# Patient Record
Sex: Male | Born: 2000 | Race: White | Hispanic: No | Marital: Single | Smoking: Never smoker
Health system: Southern US, Community
[De-identification: ages and names within clinical notes are randomized; demographics above are authoritative.]

## PROBLEM LIST (undated history)

## (undated) DIAGNOSIS — K219 Gastro-esophageal reflux disease without esophagitis: Secondary | ICD-10-CM

## (undated) HISTORY — PX: OTHER SURGICAL HISTORY: SHX169

## (undated) HISTORY — DX: Gastro-esophageal reflux disease without esophagitis: K21.9

---
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
## 2020-03-14 ENCOUNTER — Other Ambulatory Visit: Payer: Self-pay

## 2020-03-14 ENCOUNTER — Emergency Department (HOSPITAL_BASED_OUTPATIENT_CLINIC_OR_DEPARTMENT_OTHER): Payer: 59

## 2020-03-14 ENCOUNTER — Encounter (HOSPITAL_BASED_OUTPATIENT_CLINIC_OR_DEPARTMENT_OTHER): Payer: Self-pay

## 2020-03-14 ENCOUNTER — Emergency Department (HOSPITAL_BASED_OUTPATIENT_CLINIC_OR_DEPARTMENT_OTHER)
Admission: EM | Admit: 2020-03-14 | Discharge: 2020-03-15 | Disposition: A | Discharge status: Home / Self Care | Payer: 59 | Attending: Emergency Medicine | Admitting: Emergency Medicine

## 2020-03-14 DIAGNOSIS — W500XXA Accidental hit or strike by another person, initial encounter: Secondary | ICD-10-CM | POA: Diagnosis not present

## 2020-03-14 DIAGNOSIS — Y9389 Activity, other specified: Secondary | ICD-10-CM | POA: Insufficient documentation

## 2020-03-14 DIAGNOSIS — Y9289 Other specified places as the place of occurrence of the external cause: Secondary | ICD-10-CM | POA: Insufficient documentation

## 2020-03-14 DIAGNOSIS — Z79899 Other long term (current) drug therapy: Secondary | ICD-10-CM | POA: Insufficient documentation

## 2020-03-14 DIAGNOSIS — Y998 Other external cause status: Secondary | ICD-10-CM | POA: Insufficient documentation

## 2020-03-14 DIAGNOSIS — T18128A Food in esophagus causing other injury, initial encounter: Secondary | ICD-10-CM | POA: Insufficient documentation

## 2020-03-14 MED ORDER — GLUCAGON HCL RDNA (DIAGNOSTIC) 1 MG IJ SOLR
1.0000 mg | Freq: Once | INTRAMUSCULAR | Status: AC
  Administered 2020-03-14: 1 mg via INTRAVENOUS
  Filled 2020-03-14: qty 1

## 2020-03-14 NOTE — ED Triage Notes (Signed)
Pt c/o feeling like something stuck in throat x 30 min while eating boneless chicken breast-NAD-spitting in emesis bag

## 2020-03-15 MED ORDER — ONDANSETRON HCL 4 MG/2ML IJ SOLN
4.0000 mg | Freq: Once | INTRAMUSCULAR | Status: AC
  Administered 2020-03-15: 4 mg via INTRAVENOUS
  Filled 2020-03-15: qty 2

## 2020-03-15 NOTE — ED Notes (Signed)
While pt was in parking lot, he vomited up food bolus. Pt is able to keep water down. Dr. Read Drivers in to re-evaluate.

## 2020-03-15 NOTE — ED Notes (Signed)
[facility] ED POV

## 2020-03-15 NOTE — ED Provider Notes (Addendum)
[facility] Provider Note: Lowella Dell, MD, FACEP  CSN: [id number] MRN: [id number] ARRIVAL: 03/14/20 at 2127 ROOM: [facility]11/[facility]   CHIEF COMPLAINT  Food Stuck in Esophagus   HISTORY OF PRESENT ILLNESS  03/15/20 12:57 AM Christian Payne is a 19 y.o. male was eating a chicken burrito about 8:30 PM when a bite got suddenly stuck in his throat.  He had a choking sensation followed by the inability to swallow even liquids.  He states he has had similar episodes in the past but never requiring medical intervention.  He has been given 2 doses of IV glucagon without relief of his symptoms and he is still unable to swallow.  Leaning forward makes his symptoms worse.  He rates associated esophageal pain as 8 out of 10, dull in nature.   History reviewed. No pertinent past medical history.  History reviewed. No pertinent surgical history.  Family History  Problem Relation Age of Onset  . Arthritis Neg Hx   . Alcohol abuse Neg Hx   . Asthma Neg Hx   . Birth defects Neg Hx   . Cancer Neg Hx   . COPD Neg Hx   . Depression Neg Hx   . Diabetes Neg Hx   . Drug abuse Neg Hx   . Early death Neg Hx   . Hearing loss Neg Hx   . Heart disease Neg Hx   . Hyperlipidemia Neg Hx   . Hypertension Neg Hx   . Kidney disease Neg Hx   . Learning disabilities Neg Hx   . Mental illness Neg Hx   . Mental retardation Neg Hx   . Miscarriages / Stillbirths Neg Hx   . Stroke Neg Hx   . Vision loss Neg Hx   . Varicose Veins Neg Hx     Social History   Tobacco Use  . Smoking status: Never Smoker  . Smokeless tobacco: Never Used  Vaping Use  . Vaping Use: Some days  Substance Use Topics  . Alcohol use: Yes    Comment: weekly  . Drug use: Never    Prior to Admission medications   Medication Sig Start Date End Date Taking? Authorizing Provider  fluticasone (FLONASE) 50 MCG/ACT nasal spray Place 2 sprays into both nostrils daily. 09/06/13 10/07/13  Georgiann Hahn, MD    HYDROcodone-acetaminophen (NORCO/VICODIN) 5-325 MG tablet Take 1 tablet by mouth every 4 (four) hours as needed for moderate pain. 03/07/16   Ronnell Freshwater, NP  levofloxacin (LEVAQUIN) 500 MG tablet Take 1 tablet (500 mg total) by mouth daily. X 7 days 03/11/15   Lowanda Foster, NP  mupirocin ointment (BACTROBAN) 2 % Apply 1 application topically 2 (two) times daily. 03/11/15   Lowanda Foster, NP    Allergies Patient has no known allergies.   REVIEW OF SYSTEMS  Negative except as noted here or in the History of Present Illness.   PHYSICAL EXAMINATION  Initial Vital Signs Blood pressure 133/75, pulse 83, temperature 99.3 F (37.4 C), temperature source Oral, resp. rate 19, height 5\' 11"  (1.803 m), weight 85.7 kg, SpO2 100 %.  Examination General: Well-developed, well-nourished male in no acute distress; appearance consistent with age of record HENT: normocephalic; atraumatic Eyes: pupils equal, round and reactive to light; extraocular muscles intact Neck: supple Heart: regular rate and rhythm Lungs: clear to auscultation bilaterally Abdomen: soft; nondistended; nontender; bowel sounds present Extremities: No deformity; full range of motion; pulses normal Neurologic: Awake, alert and oriented; motor function intact in all  extremities and symmetric; no facial droop Skin: Warm and dry Psychiatric: Normal mood and affect   RESULTS  Summary of this visit's results, reviewed and interpreted by myself:   EKG Interpretation  Date/Time:    Ventricular Rate:    PR Interval:    QRS Duration:   QT Interval:    QTC Calculation:   R Axis:     Text Interpretation:        Laboratory Studies: No results found for this or any previous visit (from the past 24 hour(s)). Imaging Studies: DG Neck Soft Tissue  Result Date: 03/14/2020 CLINICAL DATA:  Question esophageal food bolus with globus sensation after eating boneless chicken. EXAM: NECK SOFT TISSUES - 1+ VIEW  COMPARISON:  None. FINDINGS: There is no evidence of retropharyngeal soft tissue swelling or gas. The epiglottis remains sharp and well-defined without enlargement. The cervical airway is unremarkable and no radio-opaque foreign body identified. Portion of the oropharynx and nasopharynx is collimated from view. Minimal spondylitic changes in the spine. No acute abnormality in the upper chest or imaged lung apices. IMPRESSION: 1. No acute abnormality. 2. Portion of the superior oropharynx and nasopharynx is collimated from view. Electronically Signed   By: Lovena Le M.D.   On: 03/14/2020 21:57    ED COURSE and MDM  Nursing notes, initial and subsequent vitals signs, including pulse oximetry, reviewed and interpreted by myself.  Vitals:   03/14/20 2139 03/14/20 2343  BP: (!) 135/99 133/75  Pulse: 86 83  Resp: 18 19  Temp: 99.3 F (37.4 C)   TempSrc: Oral   SpO2: 100% 100%  Weight: 85.7 kg   Height: 5\' 11"  (1.803 m)    Medications  ondansetron (ZOFRAN) injection 4 mg (has no administration in time range)  glucagon (human recombinant) (GLUCAGEN) injection 1 mg (1 mg Intravenous Given 03/14/20 2323)  glucagon (human recombinant) (GLUCAGEN) injection 1 mg (1 mg Intravenous Given 03/14/20 2355)   1:12 AM Dr. Lucio Edward accepts patient for transfer to [facility] where he expects to perform an endoscopy as the first case in the morning.  1:37 AM Patient vomited and relieved his obstruction.  He is now able to swallow without difficulty.  Will cancel consult with Dr. Fuller Plan and have him follow-up as an outpatient.  PROCEDURES  Procedures   ED DIAGNOSES     ICD-10-CM   1. Food impaction of esophagus, initial encounter  E83.151V        Shanon Rosser, MD 03/15/20 0119    Shanon Rosser, MD 03/15/20 [phone]

## 2020-05-08 ENCOUNTER — Encounter: Payer: Self-pay | Admitting: Gastroenterology

## 2020-05-08 ENCOUNTER — Telehealth: Payer: Self-pay | Admitting: Gastroenterology

## 2020-05-08 ENCOUNTER — Ambulatory Visit (INDEPENDENT_AMBULATORY_CARE_PROVIDER_SITE_OTHER): Payer: 59 | Admitting: Gastroenterology

## 2020-05-08 VITALS — BP 118/58 | HR 75 | Ht 71.0 in | Wt 192.0 lb

## 2020-05-08 DIAGNOSIS — K219 Gastro-esophageal reflux disease without esophagitis: Secondary | ICD-10-CM

## 2020-05-08 DIAGNOSIS — R1319 Other dysphagia: Secondary | ICD-10-CM

## 2020-05-08 DIAGNOSIS — R1310 Dysphagia, unspecified: Secondary | ICD-10-CM

## 2020-05-08 MED ORDER — PANTOPRAZOLE SODIUM 40 MG PO TBEC: 40.0000 mg | DELAYED_RELEASE_TABLET | Freq: Every day | ORAL | 11 refills | Status: DC

## 2020-05-08 NOTE — Telephone Encounter (Signed)
Patient called and he decided to go with a OTC medication instead please advise

## 2020-05-08 NOTE — Progress Notes (Signed)
History of Present Illness: This is a 19 year old male referred by Georgiann Hahn, MD for the evaluation of food impaction, dysphagia, GERD.  He relates a several year history of intermittent heartburn and intermittent solid food dysphagia.  He presented to [facility] ED in late June with a food impaction, unable to handle secretions.  He was eating chicken prior to the event.  He was in the process of being transferred to [facility] emergency department for endoscopy with food impaction removal when he spontaneously vomited and the food impaction cleared. Denies weight loss, abdominal pain, constipation, diarrhea, change in stool caliber, melena, hematochezia, nausea, vomiting, chest pain.    No Known Allergies Outpatient Medications Prior to Visit  Medication Sig Dispense Refill  . fluticasone (FLONASE) 50 MCG/ACT nasal spray Place 2 sprays into both nostrils daily. 16 g 2   No facility-administered medications prior to visit.   History reviewed. No pertinent past medical history. History reviewed. No pertinent surgical history. Social History   Socioeconomic History  . Marital status: Single    Spouse name: Not on file  . Number of children: Not on file  . Years of education: Not on file  . Highest education level: Not on file  Occupational History  . Not on file  Tobacco Use  . Smoking status: Never Smoker  . Smokeless tobacco: Never Used  Vaping Use  . Vaping Use: Some days  Substance and Sexual Activity  . Alcohol use: Yes    Alcohol/week: 3.0 standard drinks    Types: 3 Shots of liquor per week    Comment: weekly   . Drug use: Never  . Sexual activity: Not on file  Other Topics Concern  . Not on file  Social History Narrative  . Not on file   Social Determinants of Health   Financial Resource Strain:   . Difficulty of Paying Living Expenses:   Food Insecurity:   . Worried About Programme researcher, broadcasting/film/video in the Last Year:   . Barista in the  Last Year:   Transportation Needs:   . Freight forwarder (Medical):   Marland Kitchen Lack of Transportation (Non-Medical):   Physical Activity:   . Days of Exercise per Week:   . Minutes of Exercise per Session:   Stress:   . Feeling of Stress :   Social Connections:   . Frequency of Communication with Friends and Family:   . Frequency of Social Gatherings with Friends and Family:   . Attends Religious Services:   . Active Member of Clubs or Organizations:   . Attends Banker Meetings:   Marland Kitchen Marital Status:    Family History  Problem Relation Age of Onset  . GER disease Father   . Arthritis Neg Hx   . Alcohol abuse Neg Hx   . Asthma Neg Hx   . Birth defects Neg Hx   . Cancer Neg Hx   . COPD Neg Hx   . Depression Neg Hx   . Diabetes Neg Hx   . Drug abuse Neg Hx   . Early death Neg Hx   . Hearing loss Neg Hx   . Heart disease Neg Hx   . Hyperlipidemia Neg Hx   . Hypertension Neg Hx   . Kidney disease Neg Hx   . Learning disabilities Neg Hx   . Mental illness Neg Hx   . Mental retardation Neg Hx   . Miscarriages / [place]  Neg Hx   . Stroke Neg Hx   . Vision loss Neg Hx   . Varicose Veins Neg Hx       Review of Systems: Pertinent positive and negative review of systems were noted in the above HPI section. All other review of systems were otherwise negative.   Physical Exam: General: Well developed, well nourished, no acute distress Head: Normocephalic and atraumatic Eyes:  sclerae anicteric, EOMI Ears: Normal auditory acuity Mouth: Not examined, mask on during Covid-19 pandemic Neck: Supple, no masses or thyromegaly Lungs: Clear throughout to auscultation Heart: Regular rate and rhythm; no murmurs, rubs or bruits Abdomen: Soft, non tender and non distended. No masses, hepatosplenomegaly or hernias noted. Normal Bowel sounds Rectal: Not done Musculoskeletal: Symmetrical with no gross deformities  Skin: No lesions on visible extremities Pulses:  Normal  pulses noted Extremities: No clubbing, cyanosis, edema or deformities noted Neurological: Alert oriented x 4, grossly nonfocal Cervical Nodes:  No significant cervical adenopathy Inguinal Nodes: No significant inguinal adenopathy Psychological:  Alert and cooperative. Normal mood and affect   Assessment and Recommendations:  1.  GERD, intermittent solid food dysphagia, recent food impaction.  Rule out esophagitis, esophageal stricture.  Follow standard antireflux measures.  Begin pantoprazole 40 mg p.o. every morning long-term.  Schedule EGD with possible biopsy and possible dilation. The risks (including bleeding, perforation, infection, missed lesions, medication reactions and possible hospitalization or surgery if complications occur), benefits, and alternatives to endoscopy with possible biopsy and possible dilation were discussed with the patient and they consent to proceed.     cc: Georgiann Hahn, MD [address] [place],  [place] [address]

## 2020-05-08 NOTE — Patient Instructions (Signed)
We have sent the following medications to your pharmacy for you to pick up at your convenience: pantoprazole.  Patient advised to avoid spicy, acidic, citrus, chocolate, mints, fruit and fruit juices.  Limit the intake of caffeine, alcohol and Soda.  Don't exercise too soon after eating.  Don't lie down within 3-4 hours of eating.  Elevate the head of your bed.  You have been scheduled for an endoscopy. Please follow written instructions given to you at your visit today. If you use inhalers (even only as needed), please bring them with you on the day of your procedure.  Thank you for choosing me and [facility].  Malcolm T. Stark, Jr., MD., FACG   

## 2020-05-09 ENCOUNTER — Other Ambulatory Visit: Payer: Self-pay

## 2020-05-09 ENCOUNTER — Ambulatory Visit (AMBULATORY_SURGERY_CENTER): Payer: 59 | Admitting: Gastroenterology

## 2020-05-09 ENCOUNTER — Encounter: Payer: Self-pay | Admitting: Gastroenterology

## 2020-05-09 VITALS — BP 107/54 | HR 75 | Temp 98.1°F | Resp 14 | Ht 71.0 in | Wt 192.0 lb

## 2020-05-09 DIAGNOSIS — K2100 Gastro-esophageal reflux disease with esophagitis, without bleeding: Secondary | ICD-10-CM

## 2020-05-09 DIAGNOSIS — K200 Eosinophilic esophagitis: Secondary | ICD-10-CM | POA: Diagnosis not present

## 2020-05-09 DIAGNOSIS — R1310 Dysphagia, unspecified: Secondary | ICD-10-CM

## 2020-05-09 DIAGNOSIS — K219 Gastro-esophageal reflux disease without esophagitis: Secondary | ICD-10-CM

## 2020-05-09 MED ORDER — SODIUM CHLORIDE 0.9 % IV SOLN: 500.0000 mL | Freq: Once | INTRAVENOUS | Status: DC

## 2020-05-09 NOTE — Patient Instructions (Signed)
YOU HAD AN ENDOSCOPIC PROCEDURE TODAY AT THE  ENDOSCOPY CENTER:   Refer to the procedure report that was given to you for any specific questions about what was found during the examination.  If the procedure report does not answer your questions, please call your gastroenterologist to clarify.  If you requested that your care partner not be given the details of your procedure findings, then the procedure report has been included in a sealed envelope for you to review at your convenience later.  YOU SHOULD EXPECT: Some feelings of bloating in the abdomen. Passage of more gas than usual.  Walking can help get rid of the air that was put into your GI tract during the procedure and reduce the bloating. If you had a lower endoscopy (such as a colonoscopy or flexible sigmoidoscopy) you may notice spotting of blood in your stool or on the toilet paper. If you underwent a bowel prep for your procedure, you may not have a normal bowel movement for a few days.  **Handout given on Post Dilation Diet**  Please Note:  You might notice some irritation and congestion in your nose or some drainage.  This is from the oxygen used during your procedure.  There is no need for concern and it should clear up in a day or so.  SYMPTOMS TO REPORT IMMEDIATELY:    Following upper endoscopy (EGD)  Vomiting of blood or coffee ground material  New chest pain or pain under the shoulder blades  Painful or persistently difficult swallowing  New shortness of breath  Fever of 100F or higher  Black, tarry-looking stools  For urgent or emergent issues, a gastroenterologist can be reached at any hour by calling [phone]. Do not use MyChart messaging for urgent concerns.    DIET:  We do recommend a small meal at first, but then you may proceed to your regular diet.  Drink plenty of fluids but you should avoid alcoholic beverages for 24 hours.  ACTIVITY:  You should plan to take it easy for the rest of today and you  should NOT DRIVE or use heavy machinery until tomorrow (because of the sedation medicines used during the test).    FOLLOW UP: Our staff will call the number listed on your records 48-72 hours following your procedure to check on you and address any questions or concerns that you may have regarding the information given to you following your procedure. If we do not reach you, we will leave a message.  We will attempt to reach you two times.  During this call, we will ask if you have developed any symptoms of COVID 19. If you develop any symptoms (ie: fever, flu-like symptoms, shortness of breath, cough etc.) before then, please call [phone].  If you test positive for Covid 19 in the 2 weeks post procedure, please call and report this information to [place].    If any biopsies were taken you will be contacted by phone or by letter within the next 1-3 weeks.  Please call us at [phone] if you have not heard about the biopsies in 3 weeks.    SIGNATURES/CONFIDENTIALITY: You and/or your care partner have signed paperwork which will be entered into your electronic medical record.  These signatures attest to the fact that that the information above on your After Visit Summary has been reviewed and is understood.  Full responsibility of the confidentiality of this discharge information lies with you and/or your care-partner.

## 2020-05-09 NOTE — Op Note (Addendum)
[facility] Patient Name: Christian Payne Procedure Date: 05/09/2020 2:17 PM MRN: [id number] Endoscopist: Meryl Dare , MD Age: 19 Referring MD:  Date of Birth: 04-20-2001 Gender: Male Account #: [id number] Procedure:                Upper GI endoscopy Indications:              Dysphagia, Recent food impaction, Suspected                            gastroesophageal reflux disease Medicines:                Monitored Anesthesia Care Procedure:                Pre-Anesthesia Assessment:                           - Prior to the procedure, a History and Physical                            was performed, and patient medications and                            allergies were reviewed. The patient's tolerance of                            previous anesthesia was also reviewed. The risks                            and benefits of the procedure and the sedation                            options and risks were discussed with the patient.                            All questions were answered, and informed consent                            was obtained. Prior Anticoagulants: The patient has                            taken no previous anticoagulant or antiplatelet                            agents. ASA Grade Assessment: I - A normal, healthy                            patient. After reviewing the risks and benefits,                            the patient was deemed in satisfactory condition to                            undergo the procedure.  After obtaining informed consent, the endoscope was                            passed under direct vision. Throughout the                            procedure, the patient's blood pressure, pulse, and                            oxygen saturations were monitored continuously. The                            Endoscope was introduced through the mouth, and                            advanced to the second part of duodenum.  The upper                            GI endoscopy was accomplished without difficulty.                            The patient tolerated the procedure well. Scope In: Scope Out: Findings:                 Mucosal changes including ringed esophagus,                            longitudinal furrows and white plaques were found                            in the mid esophagus and in the distal esophagus.                            Biopsies were taken with a cold forceps for                            histology. A guidewire was placed and the scope was                            withdrawn. Dilations were performed with Savary                            dilators with mild resistance at 15 mm and 16 mm.                           LA Grade A (one or more mucosal breaks less than 5                            mm, not extending between tops of 2 mucosal folds)                            esophagitis with no bleeding was found at the EGJ,  40 cm from the incisors.                           The exam of the esophagus was otherwise normal.                           The entire examined stomach was normal.                           The duodenal bulb and second portion of the                            duodenum were normal. Complications:            No immediate complications. Estimated Blood Loss:     Estimated blood loss was minimal. Impression:               - Esophageal mucosal changes suggestive of                            eosinophilic esophagitis. Biopsied. Dilated.                           - LA Grade A esophagitis at the EGJ, 40 cm                           - Normal stomach.                           - Normal duodenal bulb and second portion of the                            duodenum. Recommendation:           - Patient has a contact number available for                            emergencies. The signs and symptoms of potential                            delayed  complications were discussed with the                            patient. Return to normal activities tomorrow.                            Written discharge instructions were provided to the                            patient.                           - Clear liquid diet for 2 hours, then advance as                            tolerated to soft diet today.                           -  Resume prior diet tomorrow.                           - Antireflux measures.                           - Continue present medications including                            pantoprazole 40 mg po qam or Nexium 20 mg (OTC) po                            qam.                           - Await pathology results.                           - Return to GI office in about 6 weeks. Meryl Dare, MD 05/09/2020 2:38:24 PM This report has been signed electronically.

## 2020-05-09 NOTE — Progress Notes (Signed)
CH - Check-in CW - VS

## 2020-05-09 NOTE — Progress Notes (Signed)
Called to room to assist during endoscopic procedure.  Patient ID and intended procedure confirmed with present staff. Received instructions for my participation in the procedure from the performing physician.  

## 2020-05-09 NOTE — Telephone Encounter (Signed)
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 Patient states he will purchase the medication over the counter and will notify us when he is released from basic training to send in the prescription to his pharmacy.

## 2020-05-09 NOTE — Progress Notes (Signed)
pt tolerated well. VSS. awake and to recovery. Report given to RN. Bite block removed and inserted without trauma.

## 2020-05-11 ENCOUNTER — Telehealth: Payer: Self-pay

## 2020-05-11 NOTE — Telephone Encounter (Signed)
  Follow up Call-  Call back number 05/09/2020  Post procedure Call Back phone  # #[phone] cell  Permission to leave phone message Yes  Some recent data might be hidden     Patient questions:  Do you have a fever, pain , or abdominal swelling? No. Pain Score  0 *  Have you tolerated food without any problems? Yes.    Have you been able to return to your normal activities? Yes.    Do you have any questions about your discharge instructions: Diet   No. Medications  No. Follow up visit  No.  Do you have questions or concerns about your Care? No.  Actions: * If pain score is 4 or above: No action needed, pain <4.  1. Have you developed a fever since your procedure? no  2.   Have you had an respiratory symptoms (SOB or cough) since your procedure? no  3.   Have you tested positive for COVID 19 since your procedure no  4.   Have you had any family members/close contacts diagnosed with the COVID 19 since your procedure?  no   If yes to any of these questions please route to Laverna Peace, RN and Karlton Lemon, RN

## 2020-05-15 ENCOUNTER — Encounter: Payer: Self-pay | Admitting: Gastroenterology

## 2020-10-24 ENCOUNTER — Telehealth: Payer: Self-pay | Admitting: Gastroenterology

## 2020-10-24 NOTE — Telephone Encounter (Signed)
Inbound call from patient requesting a call back please.  States has been having trouble swallowing.

## 2020-10-24 NOTE — Telephone Encounter (Signed)
Patient with continued dysphagia.  He tested positive for COVID on 10/21/20.  He will come in on 10/31/20 at 9:00 to discuss with Willette Cluster RNP

## 2020-10-31 ENCOUNTER — Ambulatory Visit (INDEPENDENT_AMBULATORY_CARE_PROVIDER_SITE_OTHER): Payer: 59 | Admitting: Nurse Practitioner

## 2020-10-31 ENCOUNTER — Encounter: Payer: Self-pay | Admitting: Nurse Practitioner

## 2020-10-31 VITALS — BP 118/80 | HR 100 | Ht 71.0 in | Wt 198.0 lb

## 2020-10-31 DIAGNOSIS — R1310 Dysphagia, unspecified: Secondary | ICD-10-CM

## 2020-10-31 DIAGNOSIS — K219 Gastro-esophageal reflux disease without esophagitis: Secondary | ICD-10-CM

## 2020-10-31 MED ORDER — ESOMEPRAZOLE MAGNESIUM 40 MG PO CPDR: 40.0000 mg | DELAYED_RELEASE_CAPSULE | Freq: Every day | ORAL | 4 refills | Status: DC

## 2020-10-31 NOTE — Progress Notes (Signed)
ASSESSMENT AND PLAN    # 20 yo male with history of heartburn / dysphagia and EGD with biopsies suspicious for eosinophilic esophagitis in August 2021.  Symptoms improved on OTC PPI and recent further improvement in symptoms after doubling dose of PPI , reducing Etoh and caffeine consumption and drinking more water.  --Continue current GERD regimen. Follow up in 2 months at which time we can hopefully decrease PPI back to once daily without worsening symptoms. Call us in interim if symptoms recur, especially dysphagia,  as treatment for possible eosinophilic esophagitis may be warranted.    HISTORY OF PRESENT ILLNESS     Primary Gastroenterologist :  [name], MD  Chief Complaint : heartburn and swallowing problems.   Christian Payne is a 20 y.o. male with PMH / PSH significant for,  but not necessarily limited to: GERD, COVID 11 Oct 2020  Christian Payne was seen he in August 2021 with complains of GERD, dysphagia and recent food impaction, please see that dictation. He underwent EGD with findings suggestive of eosinophilic esophagitis Biopsies showed increased intraepithelial eosinophils as can be seen with GERD or EoE. 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   Interval History:  Dysphagia and heartburn improved only about 50% on OTC PPI . A week or so ago he increased PPI to BID, reduced Etoh and caffeine consumption and started drinking more water. Since then, the heartburn and dysphagia have further improved and he is about 75 % better. He is able to eat meat but is more careful to chew well, eat slowly, etc.  Heartburn improved.     Previous Endoscopic Evaluations / Pertinent Studies:   EGD Aug 2021 for GERD and recent food impaction - Esophageal mucosal changes suggestive of eosinophilic esophagitis. Biopsied. Dilated. - LA Grade A esophagitis at the EGJ, 40 cm - Normal  stomach. - Normal duodenal bulb and second portion of the duodenum.  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 Microscopic Comment The finding of increased intraepithelial eosinophils in the esophageal mucosa is not specific, but is most commonly seen in gastroesophageal reflux and eosinophilic esophagitis. Recommend correlation with clinical picture andendoscopic location of current biopsy.   Current Medications, Allergies, Past Surgical History, Family History and Social History were reviewed in [employer] record.   Current Outpatient Medications  Medication Sig Dispense Refill  Omeprazole 20 mg Bid No current facility-administered medications for this visit.    Review of Systems: No chest pain. No shortness of breath. No urinary complaints.   PHYSICAL EXAM :    Wt Readings from Last 3 Encounters:  05/09/20 192 lb (87.1 kg)  05/08/20 192 lb (87.1 kg)  03/14/20 189 lb (85.7 kg)    BP 118/80   Pulse 100   Ht 5\' 11"  (1.803 m)   Wt 198 lb (89.8 kg)   SpO2 98%   BMI 27.62 kg/m  Constitutional:  Pleasant well developed male in no acute distress. Psychiatric: Normal mood and affect. Behavior is normal. EENT: Pupils normal.  Conjunctivae are normal. No scleral icterus. Neck supple.  Cardiovascular: Normal rate, regular rhythm. No edema Pulmonary/chest: Effort normal and breath sounds normal. No wheezing, rales or rhonchi. Abdominal: Soft, nondistended, nontender. Bowel sounds active throughout. There are no masses palpable. No hepatomegaly. Neurological: Alert and oriented to person place  and time. Skin: Skin is warm and dry. No rashes noted.  Willette Cluster, NP  10/31/2020, 8:43 AM

## 2020-10-31 NOTE — Patient Instructions (Signed)
If you are age 22 or older, your body mass index should be between 23-30. Your Body mass index is 27.62 kg/m. If this is out of the aforementioned range listed, please consider follow up with your Primary Care Provider.  If you are age 78 or younger, your body mass index should be between 19-25. Your Body mass index is 27.62 kg/m. If this is out of the aformentioned range listed, please consider follow up with your Primary Care Provider.   We have sent the following medications to your pharmacy for you to pick up at your convenience: Nexium 40 mg  Follow up in 2 months.  Thank you for choosing me and [facility].  Willette Cluster , NP

## 2021-06-25 IMAGING — DX DG NECK SOFT TISSUE
2 series · 2 of 2 positions shown · non-contrast
Comparison: None.

CLINICAL DATA: Question esophageal food bolus with globus sensation
after eating boneless chicken.

EXAM:
NECK SOFT TISSUES - 1+ VIEW

[neck lat]
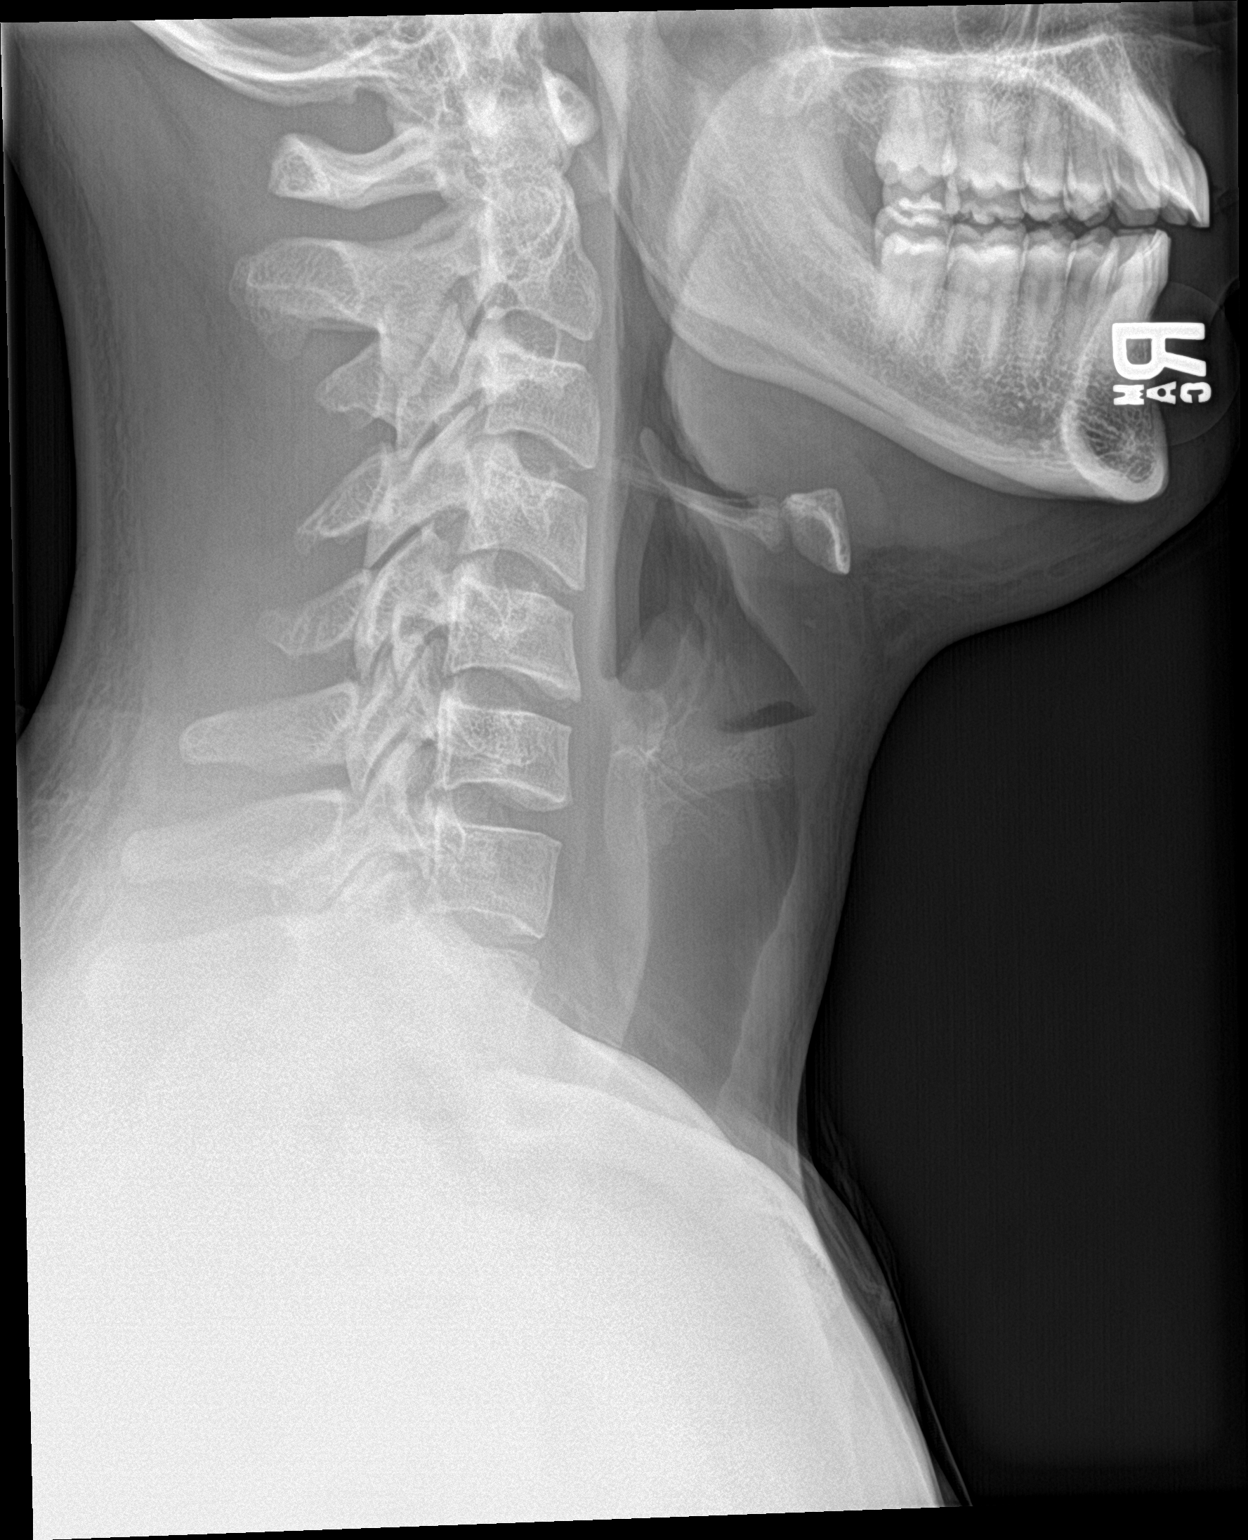

[neck ap]
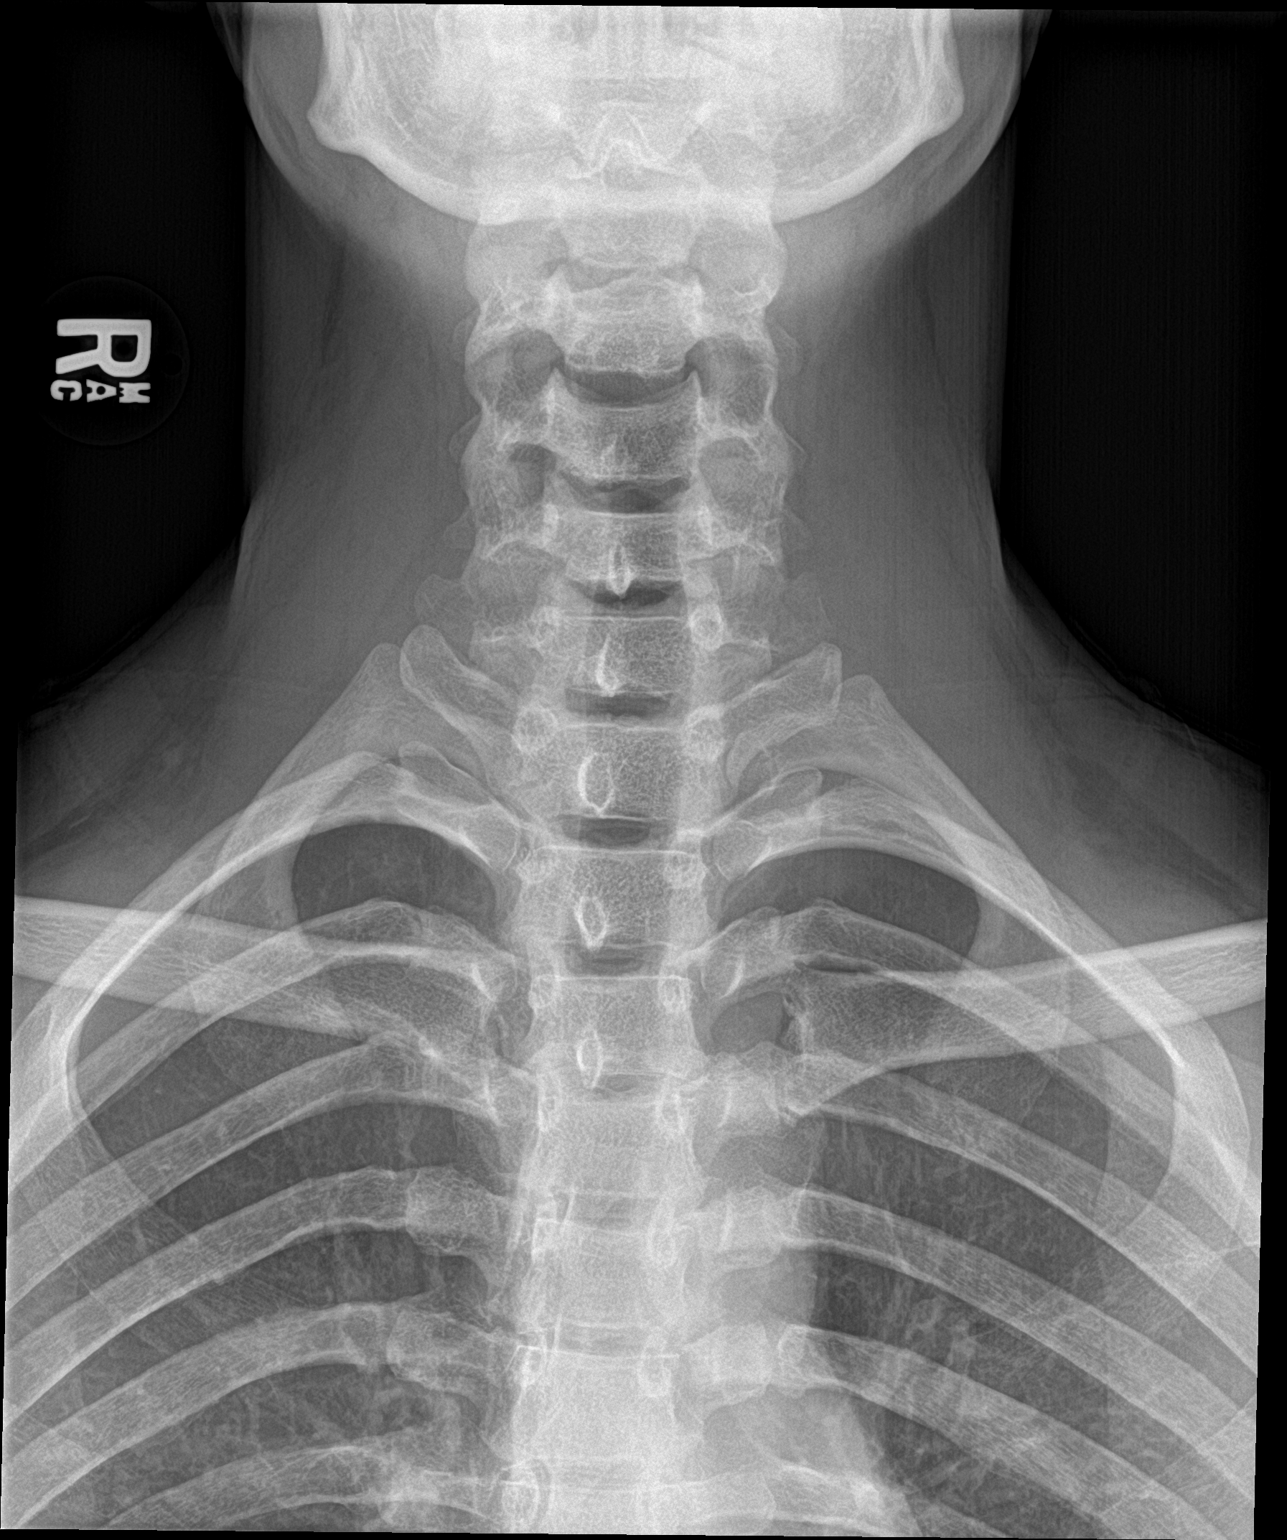

[2 of 2 positions shown; findings below may reference images not displayed]

FINDINGS: There is no evidence of retropharyngeal soft tissue swelling or gas.
The epiglottis remains sharp and well-defined without enlargement.
The cervical airway is unremarkable and no radio-opaque foreign body
identified. Portion of the oropharynx and nasopharynx is collimated
from view. Minimal spondylitic changes in the spine. No acute
abnormality in the upper chest or imaged lung apices.
IMPRESSION: 1. No acute abnormality.
2. Portion of the superior oropharynx and nasopharynx is collimated
from view.

## 2021-11-02 ENCOUNTER — Other Ambulatory Visit: Payer: Self-pay | Admitting: Nurse Practitioner

## 2022-01-16 ENCOUNTER — Ambulatory Visit (INDEPENDENT_AMBULATORY_CARE_PROVIDER_SITE_OTHER): Payer: PRIVATE HEALTH INSURANCE | Admitting: Nurse Practitioner

## 2022-01-16 ENCOUNTER — Encounter: Payer: Self-pay | Admitting: Nurse Practitioner

## 2022-01-16 VITALS — BP 108/63 | HR 72 | Temp 98.2°F | Ht 70.87 in | Wt 200.9 lb

## 2022-01-16 DIAGNOSIS — K219 Gastro-esophageal reflux disease without esophagitis: Secondary | ICD-10-CM

## 2022-01-16 DIAGNOSIS — Z7689 Persons encountering health services in other specified circumstances: Secondary | ICD-10-CM

## 2022-01-16 DIAGNOSIS — Z6828 Body mass index (BMI) 28.0-28.9, adult: Secondary | ICD-10-CM | POA: Diagnosis not present

## 2022-01-16 NOTE — Progress Notes (Signed)
? ?New Patient Office Visit ? ?Subjective   ? ?Patient ID: Christian Payne, male    DOB: 04-20-2001  Age: 21 y.o. MRN: [id number] ? ?CC:  ?Chief Complaint  ?Patient presents with  ? New Patient (Initial Visit)  ? ? ?HPI ?Christian LeedsAlexander M Zanetti presents to establish care ?-patient had severe GI virus last week with vomiting and nausea. Had to stay out of work. Did go to urgent care. Was given medication for nausea. After that, he did develop significant diarrhea. Had to take OTC imodium. This caused puffy in the face. Went back to the urgent care. Got steroid injection and is now on prednisone and is doing much better. There is concern for allergy to imodium, though not said for sure  ?-history of GERD. Does take nexium everyday.  ? ?Outpatient Encounter Medications as of 01/16/2022  ?Medication Sig  ? esomeprazole (NEXIUM) 40 MG capsule TAKE 1 CAPSULE (40 MG TOTAL) BY MOUTH DAILY AT 12 NOON.  ? ondansetron (ZOFRAN-ODT) 4 MG disintegrating tablet Take 4 mg by mouth every 8 (eight) hours.  ? predniSONE (DELTASONE) 20 MG tablet Take 40 mg by mouth every morning.  ? ?No facility-administered encounter medications on file as of 01/16/2022.  ? ? ?History reviewed. No pertinent past medical history. ? ?Past Surgical History:  ?Procedure Laterality Date  ? wisdom teeth removal    ? ? ?Family History  ?Problem Relation Age of Onset  ? GER disease Father   ? Lung cancer Paternal Grandfather   ? Arthritis Neg Hx   ? Alcohol abuse Neg Hx   ? Asthma Neg Hx   ? Birth defects Neg Hx   ? Cancer Neg Hx   ? COPD Neg Hx   ? Depression Neg Hx   ? Diabetes Neg Hx   ? Drug abuse Neg Hx   ? Early death Neg Hx   ? Hearing loss Neg Hx   ? Heart disease Neg Hx   ? Hyperlipidemia Neg Hx   ? Hypertension Neg Hx   ? Kidney disease Neg Hx   ? Learning disabilities Neg Hx   ? Mental illness Neg Hx   ? Mental retardation Neg Hx   ? Miscarriages / Stillbirths Neg Hx   ? Stroke Neg Hx   ? Vision loss Neg Hx   ? Varicose Veins Neg Hx   ? Esophageal  cancer Neg Hx   ? Colon cancer Neg Hx   ? Rectal cancer Neg Hx   ? Stomach cancer Neg Hx   ? ? ?Social History  ? ?Socioeconomic History  ? Marital status: Single  ?  Spouse name: Not on file  ? Number of children: Not on file  ? Years of education: Not on file  ? Highest education level: Not on file  ?Occupational History  ? Not on file  ?Tobacco Use  ? Smoking status: Never  ? Smokeless tobacco: Never  ?Vaping Use  ? Vaping Use: Some days  ?Substance and Sexual Activity  ? Alcohol use: Yes  ?  Alcohol/week: 3.0 standard drinks  ?  Types: 3 Shots of liquor per week  ?  Comment: weekly   ? Drug use: Never  ? Sexual activity: Yes  ?  Birth control/protection: Condom  ?Other Topics Concern  ? Not on file  ?Social History Narrative  ? Not on file  ? ?Social Determinants of Health  ? ?Financial Resource Strain: Not on file  ?Food Insecurity: Not on file  ?Transportation Needs: Not  on file  ?Physical Activity: Not on file  ?Stress: Not on file  ?Social Connections: Not on file  ?Intimate Partner Violence: Not on file  ? ? ?Review of Systems  ?Constitutional:  Negative for chills, fever and malaise/fatigue.  ?HENT:  Negative for congestion, sinus pain and sore throat.   ?Eyes: Negative.   ?Respiratory:  Negative for cough, shortness of breath and wheezing.   ?Cardiovascular:  Negative for chest pain, palpitations and leg swelling.  ?Gastrointestinal:  Positive for diarrhea and heartburn. Negative for constipation, nausea and vomiting.  ?Genitourinary: Negative.   ?Musculoskeletal:  Negative for myalgias.  ?Skin: Negative.   ?Neurological:  Negative for dizziness and headaches.  ?Endo/Heme/Allergies:  Does not bruise/bleed easily.  ?     Possible medication allergy   ?Psychiatric/Behavioral:  Negative for depression. The patient is not nervous/anxious.   ? ?  ? ? ?Objective   ? ?Today's Vitals  ? 01/16/22 1632  ?BP: 108/63  ?Pulse: 72  ?Temp: 98.2 ?F (36.8 ?C)  ?SpO2: 97%  ?Weight: 200 lb 14.4 oz (91.1 kg)  ?Height: 5'  10.87" (1.8 m)  ? ?Body mass index is 28.13 kg/m?.  ? ?Physical Exam ?Vitals and nursing note reviewed.  ?Constitutional:   ?   Appearance: Normal appearance. He is well-developed.  ?HENT:  ?   Head: Normocephalic and atraumatic.  ?   Nose: Nose normal.  ?   Mouth/Throat:  ?   Mouth: Mucous membranes are moist.  ?   Pharynx: Oropharynx is clear.  ?Eyes:  ?   Extraocular Movements: Extraocular movements intact.  ?   Conjunctiva/sclera: Conjunctivae normal.  ?   Pupils: Pupils are equal, round, and reactive to light.  ?Cardiovascular:  ?   Rate and Rhythm: Normal rate and regular rhythm.  ?   Pulses: Normal pulses.  ?   Heart sounds: Normal heart sounds.  ?Pulmonary:  ?   Effort: Pulmonary effort is normal.  ?   Breath sounds: Normal breath sounds.  ?Abdominal:  ?   General: Bowel sounds are normal. There is no distension.  ?   Palpations: Abdomen is soft. There is no mass.  ?   Tenderness: There is no abdominal tenderness. There is no right CVA tenderness, left CVA tenderness, guarding or rebound.  ?   Hernia: No hernia is present.  ?Musculoskeletal:     ?   General: Normal range of motion.  ?   Cervical back: Normal range of motion and neck supple.  ?Lymphadenopathy:  ?   Cervical: No cervical adenopathy.  ?Skin: ?   General: Skin is warm and dry.  ?   Capillary Refill: Capillary refill takes less than 2 seconds.  ?Neurological:  ?   General: No focal deficit present.  ?   Mental Status: He is alert and oriented to person, place, and time.  ?Psychiatric:     ?   Mood and Affect: Mood normal.     ?   Behavior: Behavior normal.     ?   Thought Content: Thought content normal.     ?   Judgment: Judgment normal.  ? ? ? ?  ? ?Assessment & Plan:  ?1. Gastroesophageal reflux disease without esophagitis ?Patient with history of GERD. Takes nexium everyday. Continue as before. Avoid triggers.  ? ?2. Body mass index 28.0-28.9, adult ?Encourage patient to limit calorie intake to 2000 cal/day or less.  He should consume a low  cholesterol, low-fat diet.  Recommend he incorporate exercise into his daily  routine.  ? ?3. Encounter to establish care ?Appointment today to establish new primary care provider. Will get progress notes from recent visit to urgent care to review and update patient chart.  ?  ?Problem List Items Addressed This Visit   ? ?  ? Digestive  ? Gastroesophageal reflux disease without esophagitis - Primary  ? Relevant Medications  ? ondansetron (ZOFRAN-ODT) 4 MG disintegrating tablet  ?  ? Other  ? Body mass index 28.0-28.9, adult  ? ?Other Visit Diagnoses   ? ? Encounter to establish care      ? ?  ? ? ?Return in about 3 weeks (around 02/06/2022) for health maintenance exam, FBW a week prior to visit. can we get progress notes from urgent care .  ? ?Carlean Jews, NP ? ? ?

## 2022-01-22 DIAGNOSIS — Z6828 Body mass index (BMI) 28.0-28.9, adult: Secondary | ICD-10-CM | POA: Insufficient documentation

## 2022-01-22 DIAGNOSIS — K219 Gastro-esophageal reflux disease without esophagitis: Secondary | ICD-10-CM | POA: Insufficient documentation

## 2022-01-29 ENCOUNTER — Other Ambulatory Visit: Payer: Self-pay

## 2022-01-29 DIAGNOSIS — Z130 Encounter for screening for diseases of the blood and blood-forming organs and certain disorders involving the immune mechanism: Secondary | ICD-10-CM

## 2022-01-29 DIAGNOSIS — Z0000 Encounter for general adult medical examination without abnormal findings: Secondary | ICD-10-CM

## 2022-01-31 ENCOUNTER — Other Ambulatory Visit: Payer: PRIVATE HEALTH INSURANCE

## 2022-01-31 DIAGNOSIS — Z0000 Encounter for general adult medical examination without abnormal findings: Secondary | ICD-10-CM

## 2022-01-31 DIAGNOSIS — Z130 Encounter for screening for diseases of the blood and blood-forming organs and certain disorders involving the immune mechanism: Secondary | ICD-10-CM

## 2022-02-01 LAB — CBC WITH DIFFERENTIAL/PLATELET
Basophils Absolute: 0.1 10*3/uL (ref 0.0–0.2)
Basos: 1 %
EOS (ABSOLUTE): 0.1 10*3/uL (ref 0.0–0.4)
Eos: 1 %
Hematocrit: 45.9 % (ref 37.5–51.0)
Hemoglobin: 15.9 g/dL (ref 13.0–17.7)
Immature Grans (Abs): 0 10*3/uL (ref 0.0–0.1)
Immature Granulocytes: 0 %
Lymphocytes Absolute: 2 10*3/uL (ref 0.7–3.1)
Lymphs: 41 %
MCH: 29.2 pg (ref 26.6–33.0)
MCHC: 34.6 g/dL (ref 31.5–35.7)
MCV: 84 fL (ref 79–97)
Monocytes Absolute: 0.4 10*3/uL (ref 0.1–0.9)
Monocytes: 9 %
Neutrophils Absolute: 2.3 10*3/uL (ref 1.4–7.0)
Neutrophils: 48 %
Platelets: 175 10*3/uL (ref 150–450)
RBC: 5.44 x10E6/uL (ref 4.14–5.80)
RDW: 12.4 % (ref 11.6–15.4)
WBC: 4.8 10*3/uL (ref 3.4–10.8)

## 2022-02-01 LAB — COMPREHENSIVE METABOLIC PANEL
ALT: 62 IU/L — ABNORMAL HIGH (ref 0–44)
AST: 46 IU/L — ABNORMAL HIGH (ref 0–40)
Albumin/Globulin Ratio: 2.1 (ref 1.2–2.2)
Albumin: 4.7 g/dL (ref 4.1–5.2)
Alkaline Phosphatase: 78 IU/L (ref 44–121)
BUN/Creatinine Ratio: 16 (ref 9–20)
BUN: 18 mg/dL (ref 6–20)
Bilirubin Total: 0.6 mg/dL (ref 0.0–1.2)
CO2: 23 mmol/L (ref 20–29)
Calcium: 9.7 mg/dL (ref 8.7–10.2)
Chloride: 102 mmol/L (ref 96–106)
Creatinine, Ser: 1.16 mg/dL (ref 0.76–1.27)
Globulin, Total: 2.2 g/dL (ref 1.5–4.5)
Glucose: 91 mg/dL (ref 70–99)
Potassium: 4.2 mmol/L (ref 3.5–5.2)
Sodium: 141 mmol/L (ref 134–144)
Total Protein: 6.9 g/dL (ref 6.0–8.5)
eGFR: 91 mL/min/{1.73_m2} (ref >59)

## 2022-02-01 LAB — TSH: TSH: 1.9 u[IU]/mL (ref 0.450–4.500)

## 2022-02-01 LAB — HEMOGLOBIN A1C
Est. average glucose Bld gHb Est-mCnc: 100 mg/dL
Hgb A1c MFr Bld: 5.1 % (ref 4.8–5.6)

## 2022-02-01 LAB — LIPID PANEL
Chol/HDL Ratio: 3.5 ratio (ref 0.0–5.0)
Cholesterol, Total: 185 mg/dL (ref 100–199)
HDL: 53 mg/dL (ref >39)
LDL Chol Calc (NIH): 121 mg/dL — ABNORMAL HIGH (ref 0–99)
Triglycerides: 56 mg/dL (ref 0–149)
VLDL Cholesterol Cal: 11 mg/dL (ref 5–40)

## 2022-02-03 NOTE — Progress Notes (Signed)
Elevated liver function and mild elevation of LDL. Discuss at visit 517/2023.

## 2022-02-06 ENCOUNTER — Ambulatory Visit (INDEPENDENT_AMBULATORY_CARE_PROVIDER_SITE_OTHER): Payer: PRIVATE HEALTH INSURANCE | Admitting: Nurse Practitioner

## 2022-02-06 NOTE — Progress Notes (Deleted)
Established patient visit   Patient: Christian Payne   DOB: 01-15-2001   21 y.o. Male  MRN: [id number] Visit Date: 02/06/2022   No chief complaint on file.  Subjective    HPI  Patient presents for annual physical -routine, fasting labs done prior to this visit.  --mild elevation of LDL --mild elevation of AST and ALT   Medications: Outpatient Medications Prior to Visit  Medication Sig   esomeprazole (NEXIUM) 40 MG capsule TAKE 1 CAPSULE (40 MG TOTAL) BY MOUTH DAILY AT 12 NOON.   ondansetron (ZOFRAN-ODT) 4 MG disintegrating tablet Take 4 mg by mouth every 8 (eight) hours.   predniSONE (DELTASONE) 20 MG tablet Take 40 mg by mouth every morning.   No facility-administered medications prior to visit.    Review of Systems  {Labs (Optional):23779}   Objective    There were no vitals taken for this visit. BP Readings from Last 3 Encounters:  01/16/22 108/63  10/31/20 118/80  05/09/20 (!) 107/54    Wt Readings from Last 3 Encounters:  01/16/22 200 lb 14.4 oz (91.1 kg)  10/31/20 198 lb (89.8 kg)  05/09/20 192 lb (87.1 kg)    Physical Exam  ***  No results found for any visits on 02/06/22.  Assessment & Plan     Problem List Items Addressed This Visit   None    No follow-ups on file.         Carlean Jews, NP  [facility] at [facility] [phone] (phone) [phone] (fax)  [facility]

## 2022-02-14 ENCOUNTER — Encounter: Payer: Self-pay | Admitting: Nurse Practitioner

## 2022-02-14 ENCOUNTER — Ambulatory Visit (INDEPENDENT_AMBULATORY_CARE_PROVIDER_SITE_OTHER): Payer: PRIVATE HEALTH INSURANCE | Admitting: Nurse Practitioner

## 2022-02-14 VITALS — BP 107/63 | HR 81 | Temp 97.8°F | Ht 70.87 in | Wt 204.7 lb

## 2022-02-14 DIAGNOSIS — Z0001 Encounter for general adult medical examination with abnormal findings: Secondary | ICD-10-CM | POA: Diagnosis not present

## 2022-02-14 DIAGNOSIS — R7989 Other specified abnormal findings of blood chemistry: Secondary | ICD-10-CM | POA: Diagnosis not present

## 2022-02-14 DIAGNOSIS — E785 Hyperlipidemia, unspecified: Secondary | ICD-10-CM

## 2022-02-14 DIAGNOSIS — Z23 Encounter for immunization: Secondary | ICD-10-CM

## 2022-02-14 DIAGNOSIS — Z298 Encounter for other specified prophylactic measures: Secondary | ICD-10-CM

## 2022-02-14 DIAGNOSIS — Z2989 Encounter for other specified prophylactic measures: Secondary | ICD-10-CM

## 2022-02-14 MED ORDER — CHLOROQUINE PHOSPHATE 250 MG PO TABS: 500.0000 mg | ORAL_TABLET | ORAL | 0 refills | Status: DC

## 2022-02-14 NOTE — Progress Notes (Signed)
Complete physical exam   Patient: Christian Payne   DOB: 2000/11/28   21 y.o. Male  MRN: [id number] Visit Date: 02/14/2022    Chief Complaint  Patient presents with   Annual Exam   Subjective    Christian Payne is a 21 y.o. male who presents today for a complete physical exam.  He reports consuming a general diet. The patient has a physically strenuous job, but has no regular exercise apart from work.  He generally feels well. He does not have additional problems to discuss today.   HPI  Annual physical -routine, fasting labs done prior to today's visit.  --mild elevation of LDL --AST and AST both elevated  -patient and his wife are getting ready to travel to [place] for mission trip. Needs to have prophylactic treatment for malaria while gone.   History reviewed. No pertinent past medical history. Past Surgical History:  Procedure Laterality Date   wisdom teeth removal     Social History   Socioeconomic History   Marital status: Single    Spouse name: Not on file   Number of children: Not on file   Years of education: Not on file   Highest education level: Not on file  Occupational History   Not on file  Tobacco Use   Smoking status: Never   Smokeless tobacco: Never  Vaping Use   Vaping Use: Some days  Substance and Sexual Activity   Alcohol use: Yes    Alcohol/week: 3.0 standard drinks    Types: 3 Shots of liquor per week    Comment: weekly    Drug use: Never   Sexual activity: Yes    Birth control/protection: Condom  Other Topics Concern   Not on file  Social History Narrative   Not on file   Social Determinants of Health   Financial Resource Strain: Not on file  Food Insecurity: Not on file  Transportation Needs: Not on file  Physical Activity: Not on file  Stress: Not on file  Social Connections: Not on file  Intimate Partner Violence: Not on file   Family Status  Relation Name Status   Mother [place] Alive   Father Jonny Ruiz Alive   Sister  Erin Alive   MGM  Alive   MGF  Alive   PGM  Alive   PGF  Alive   Neg Hx  (Not Specified)   Family History  Problem Relation Age of Onset   GER disease Father    Lung cancer Paternal Grandfather    Arthritis Neg Hx    Alcohol abuse Neg Hx    Asthma Neg Hx    Birth defects Neg Hx    Cancer Neg Hx    COPD Neg Hx    Depression Neg Hx    Diabetes Neg Hx    Drug abuse Neg Hx    Early death Neg Hx    Hearing loss Neg Hx    Heart disease Neg Hx    Hyperlipidemia Neg Hx    Hypertension Neg Hx    Kidney disease Neg Hx    Learning disabilities Neg Hx    Mental illness Neg Hx    Mental retardation Neg Hx    Miscarriages / Stillbirths Neg Hx    Stroke Neg Hx    Vision loss Neg Hx    Varicose Veins Neg Hx    Esophageal cancer Neg Hx    Colon cancer Neg Hx    Rectal cancer Neg Hx  Stomach cancer Neg Hx    Allergies  Allergen Reactions   Loperamide    Trimethoprim Hydrochloride [Trimethoprim] Nausea And Vomiting    Patient Care Team: Ronnell Freshwater, NP as PCP - General (Family Medicine)   Medications: Outpatient Medications Prior to Visit  Medication Sig   esomeprazole (NEXIUM) 40 MG capsule TAKE 1 CAPSULE (40 MG TOTAL) BY MOUTH DAILY AT 12 NOON.   [DISCONTINUED] ondansetron (ZOFRAN-ODT) 4 MG disintegrating tablet Take 4 mg by mouth every 8 (eight) hours.   [DISCONTINUED] predniSONE (DELTASONE) 20 MG tablet Take 40 mg by mouth every morning.   No facility-administered medications prior to visit.    Review of Systems  Constitutional:  Negative for activity change, chills, fatigue and fever.  HENT:  Negative for congestion, postnasal drip, rhinorrhea, sinus pressure, sinus pain, sneezing and sore throat.   Eyes: Negative.   Respiratory:  Negative for cough, shortness of breath and wheezing.   Cardiovascular:  Negative for chest pain and palpitations.  Gastrointestinal:  Negative for constipation, diarrhea, nausea and vomiting.  Endocrine: Negative for cold  intolerance, heat intolerance, polydipsia and polyuria.  Genitourinary:  Negative for dysuria, frequency and urgency.  Musculoskeletal:  Negative for back pain and myalgias.  Skin:  Negative for rash.  Allergic/Immunologic: Negative for environmental allergies.  Neurological:  Negative for dizziness, weakness and headaches.  Psychiatric/Behavioral:  The patient is not nervous/anxious.    Last CBC Lab Results  Component Value Date   WBC 4.8 01/31/2022   HGB 15.9 01/31/2022   HCT 45.9 01/31/2022   MCV 84 01/31/2022   MCH 29.2 01/31/2022   RDW 12.4 01/31/2022   PLT 175 62/94/7654   Last metabolic panel Lab Results  Component Value Date   GLUCOSE 91 01/31/2022   NA 141 01/31/2022   K 4.2 01/31/2022   CL 102 01/31/2022   CO2 23 01/31/2022   BUN 18 01/31/2022   CREATININE 1.16 01/31/2022   EGFR 91 01/31/2022   CALCIUM 9.7 01/31/2022   PROT 6.9 01/31/2022   ALBUMIN 4.7 01/31/2022   LABGLOB 2.2 01/31/2022   AGRATIO 2.1 01/31/2022   BILITOT 0.6 01/31/2022   ALKPHOS 78 01/31/2022   AST 46 (H) 01/31/2022   ALT 62 (H) 01/31/2022   Last lipids Lab Results  Component Value Date   CHOL 185 01/31/2022   HDL 53 01/31/2022   LDLCALC 121 (H) 01/31/2022   TRIG 56 01/31/2022   CHOLHDL 3.5 01/31/2022   Last hemoglobin A1c Lab Results  Component Value Date   HGBA1C 5.1 01/31/2022   Last thyroid functions Lab Results  Component Value Date   TSH 1.900 01/31/2022       Objective    BP 107/63   Pulse 81   Temp 97.8 F (36.6 C)   Ht 5' 10.87" (1.8 m)   Wt 204 lb 11.2 oz (92.9 kg)   SpO2 97%   BMI 28.66 kg/m  BP Readings from Last 3 Encounters:  02/14/22 107/63  01/16/22 108/63  10/31/20 118/80    Wt Readings from Last 3 Encounters:  02/14/22 204 lb 11.2 oz (92.9 kg)  01/16/22 200 lb 14.4 oz (91.1 kg)  10/31/20 198 lb (89.8 kg)     Physical Exam Vitals and nursing note reviewed.  Constitutional:      Appearance: Normal appearance. He is well-developed.  HENT:      Head: Normocephalic and atraumatic.     Right Ear: Tympanic membrane, ear canal and external ear normal.     Left  Ear: Tympanic membrane, ear canal and external ear normal.     Nose: Nose normal.     Mouth/Throat:     Mouth: Mucous membranes are moist.     Pharynx: Oropharynx is clear.  Eyes:     Extraocular Movements: Extraocular movements intact.     Conjunctiva/sclera: Conjunctivae normal.     Pupils: Pupils are equal, round, and reactive to light.  Cardiovascular:     Rate and Rhythm: Normal rate and regular rhythm.     Pulses: Normal pulses.     Heart sounds: Normal heart sounds.  Pulmonary:     Effort: Pulmonary effort is normal.     Breath sounds: Normal breath sounds.  Abdominal:     General: Bowel sounds are normal. There is no distension.     Palpations: Abdomen is soft. There is no mass.     Tenderness: There is no abdominal tenderness. There is no right CVA tenderness, left CVA tenderness, guarding or rebound.     Hernia: No hernia is present.  Musculoskeletal:        General: Normal range of motion.     Cervical back: Normal range of motion and neck supple.  Lymphadenopathy:     Cervical: No cervical adenopathy.  Skin:    General: Skin is warm and dry.     Capillary Refill: Capillary refill takes less than 2 seconds.  Neurological:     General: No focal deficit present.     Mental Status: He is alert and oriented to person, place, and time.  Psychiatric:        Mood and Affect: Mood normal.        Behavior: Behavior normal.        Thought Content: Thought content normal.        Judgment: Judgment normal.      Last depression screening scores    02/14/2022    4:33 PM 01/16/2022    4:51 PM 11/14/2016    5:37 PM  PHQ 2/9 Scores  PHQ - 2 Score 1 0 0  PHQ- 9 Score $Remov'3 3 1   'nwrcNW$ Last fall risk screening     View : No data to display.           Assessment & Plan    1. Encounter for general adult medical examination with abnormal findings Annual  physical today.  2. Dyslipidemia, goal LDL below 100 Reviewed labs with mild elevation of LDL. Recommend patient limit intake of fried and fatty foods. He should increase intake of lean proteins and green leafy vegetables. Adding exercise into daily routine will also be beneficial.  Recheck fasting lipids in 1 year.  3. Elevated liver function tests Mild elevation of both AST and ALT.unclear etiology.  Recheck in 6 months.  Further testing today ordered as indicated.  4. Need for Tdap vaccination Tdap vaccine administered during today's visit.   - Tdap vaccine greater than or equal to 7yo IM  5. Need for malaria prophylaxis Will prophylactically treat for malaria with chloroquine 500 mg weekly.  Should start 1 week prior to taking a trip.  Should continue for additional 4 weeks after return. - chloroquine (ARALEN) 250 MG tablet; Take 2 tablets (500 mg total) by mouth once a week.  Dispense: 14 tablet; Refill: 0  6. Need for hepatitis A immunization Immunization for hepatitis A administered during today's visit. - Hepatitis A vaccine adult IM      Immunization History  Administered Date(s) Administered   DTaP 04/18/1999,  06/21/1999, 08/27/1999, 05/28/2000, 08/11/2003   HPV 9-valent 09/07/2014, 09/28/2015   HPV Quadrivalent 09/06/2013   Hepatitis A 04/24/2007, 03/31/2008   Hepatitis A, Adult 02/14/2022   Hepatitis B 1998/10/24, 04/18/1999, 10/29/1999   HiB (PRP-OMP) 04/18/1999, 06/21/1999, 08/27/1999, 05/28/2000   IPV 04/18/1999, 06/21/1999, 10/29/1999, 08/11/2003   Influenza Nasal 07/07/2007, 08/10/2008, 07/31/2009, 09/03/2011, 07/08/2012   Influenza Split 07/26/2002, 08/10/2004, 07/30/2005   Influenza,Quad,Nasal, Live 07/06/2013, 09/07/2014   Influenza,inj,quad, With Preservative 09/28/2015, 08/12/2016   Janssen (J&J) SARS-COV-2 Vaccination 12/12/2019   MMR 02/14/2000, 08/11/2003   Meningococcal Conjugate 06/29/2010, 09/28/2015   Pneumococcal Conjugate-13 04/18/1999,  06/21/1999, 08/27/1999, 02/14/2000   Tdap 05/15/2010, 03/07/2016, 02/14/2022    Health Maintenance  Topic Date Due   HIV Screening  Never done   Hepatitis C Screening  Never done   COVID-19 Vaccine (2 - Booster for Janssen series) 02/06/2020   TETANUS/TDAP  02/15/2032   HPV VACCINES  Completed   INFLUENZA VACCINE  Discontinued    Discussed health benefits of physical activity, and encouraged him to engage in regular exercise appropriate for his age and condition.  Problem List Items Addressed This Visit       Other   Dyslipidemia, goal LDL below 100   Elevated liver function tests   Other Visit Diagnoses     Encounter for general adult medical examination with abnormal findings    -  Primary   Need for Tdap vaccination       Relevant Orders   Tdap vaccine greater than or equal to 7yo IM (Completed)   Need for malaria prophylaxis       Relevant Medications   chloroquine (ARALEN) 250 MG tablet   Need for hepatitis A immunization       Relevant Orders   Hepatitis A vaccine adult IM (Completed)        Return in about 1 year (around 02/15/2023), or 6 months - needs CMP draw, for health maintenance exam.        Ronnell Freshwater, NP  [place] at [facility] [phone] (phone) [phone] (fax)  [place]

## 2022-02-18 DIAGNOSIS — E785 Hyperlipidemia, unspecified: Secondary | ICD-10-CM | POA: Insufficient documentation

## 2022-02-18 DIAGNOSIS — R7989 Other specified abnormal findings of blood chemistry: Secondary | ICD-10-CM | POA: Insufficient documentation

## 2022-05-15 ENCOUNTER — Other Ambulatory Visit: Payer: Self-pay | Admitting: Nurse Practitioner

## 2022-07-01 NOTE — Progress Notes (Signed)
Erroneous note

## 2022-11-12 ENCOUNTER — Other Ambulatory Visit: Payer: Self-pay | Admitting: Nurse Practitioner

## 2022-11-18 ENCOUNTER — Other Ambulatory Visit: Payer: Self-pay

## 2022-11-18 ENCOUNTER — Telehealth: Payer: Self-pay | Admitting: Nurse Practitioner

## 2022-11-18 MED ORDER — ESOMEPRAZOLE MAGNESIUM 40 MG PO CPDR: 40.0000 mg | DELAYED_RELEASE_CAPSULE | Freq: Every day | ORAL | 1 refills | Status: DC

## 2022-11-18 NOTE — Telephone Encounter (Signed)
Refill sent to patients pharmacy. 

## 2022-11-18 NOTE — Telephone Encounter (Signed)
Inbound call from pt , stated pharmacy told him to call us to get refill on esomeprazole .Please advise

## 2022-11-18 NOTE — Telephone Encounter (Signed)
Attempted to return patients call. Patient needs to be schedule for a follow up appointment for further refills because he has not been seen in 2 years.

## 2022-11-18 NOTE — Telephone Encounter (Signed)
Inbound call from pt, inform pt he need a follow up appt for further refills , pt is schedule for 12/23/22 to see Dr.Stark.. can he get a refill until the day..Please advise

## 2022-12-23 ENCOUNTER — Encounter: Payer: Self-pay | Admitting: Gastroenterology

## 2022-12-23 ENCOUNTER — Ambulatory Visit (INDEPENDENT_AMBULATORY_CARE_PROVIDER_SITE_OTHER): Payer: PRIVATE HEALTH INSURANCE | Admitting: Gastroenterology

## 2022-12-23 VITALS — BP 96/64 | HR 68 | Ht 71.0 in | Wt 208.2 lb

## 2022-12-23 DIAGNOSIS — K2100 Gastro-esophageal reflux disease with esophagitis, without bleeding: Secondary | ICD-10-CM | POA: Diagnosis not present

## 2022-12-23 MED ORDER — ESOMEPRAZOLE MAGNESIUM 40 MG PO CPDR: 40.0000 mg | DELAYED_RELEASE_CAPSULE | Freq: Every day | ORAL | 11 refills | Status: DC

## 2022-12-23 NOTE — Progress Notes (Signed)
    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  Esophageal biopsies in 2021 showed 25 eosinophils/hpf.  His symptoms have been very well controlled on daily Nexium.  He occasionally has missed his daily PPI and notes a returning in heartburn symptoms.  EGD Aug 2021    Labs / Imaging       Latest Ref Rng & Units 01/31/2022    8:21 AM  Hepatic Function  Total Protein 6.0 - 8.5 g/dL 6.9   Albumin 4.1 - 5.2 g/dL 4.7   AST 0 - 40 IU/L 46   ALT 0 - 44 IU/L 62   Alk Phosphatase 44 - 121 IU/L 78   Total Bilirubin 0.0 - 1.2 mg/dL 0.6        Latest Ref Rng & Units 01/31/2022    8:21 AM  CBC  WBC 3.4 - 10.8 x10E3/uL 4.8   Hemoglobin 13.0 - 17.7 g/dL 15.9   Hematocrit 37.5 - 51.0 % 45.9   Platelets 150 - 450 x10E3/uL 175    Current Medications, Allergies, Past Medical History, Past Surgical History, Family History and Social History were reviewed in [employer] record.   Physical Exam: General: Well developed, well nourished, no acute distress Head: Normocephalic and atraumatic Eyes: Sclerae anicteric, EOMI Ears: Normal auditory acuity Mouth: No deformities or lesions noted Lungs: Clear throughout to auscultation Heart: Regular rate and rhythm; No murmurs, rubs or bruits Abdomen: Soft, non tender and non distended. No masses, hepatosplenomegaly or hernias noted. Normal Bowel sounds Rectal: Not done Musculoskeletal: Symmetrical with no gross deformities  Pulses:  Normal pulses noted Extremities: No edema or deformities noted Neurological: Alert oriented x 4, grossly nonfocal Psychological:  Alert and cooperative. Normal mood and affect   [place] Coco T. Fuller Plan, MD 12/23/2022, 10:08 AM

## 2022-12-23 NOTE — Patient Instructions (Signed)
We have sent the following medications to your pharmacy for you to pick up at your convenience: Nexium   Please follow up with Dr Fuller Plan in 1 year.  _______________________________________________________  If your blood pressure at your visit was 140/90 or greater, please contact your primary care physician to follow up on this.  _______________________________________________________  If you are age 24 or older, your body mass index should be between 23-30. Your Body mass index is 29.04 kg/m. If this is out of the aforementioned range listed, please consider follow up with your Primary Care Provider.  If you are age 37 or younger, your body mass index should be between 19-25. Your Body mass index is 29.04 kg/m. If this is out of the aformentioned range listed, please consider follow up with your Primary Care Provider.   ________________________________________________________  The [place] [facility] providers would like to encourage you to use [facility] to communicate with providers for non-urgent requests or questions.  Due to long hold times on the telephone, sending your provider a message by [facility] may be a faster and more efficient way to get a response.  Please allow 48 business hours for a response.  Please remember that this is for non-urgent requests.  _______________________________________________________  Due to recent changes in healthcare laws, you may see the results of your imaging and laboratory studies on MyChart before your provider has had a chance to review them.  We understand that in some cases there may be results that are confusing or concerning to you. Not all laboratory results come back in the same time frame and the provider may be waiting for multiple results in order to interpret others.  Please give [place] 48 hours in order for your provider to thoroughly review all the results before contacting the office for clarification of your results.

## 2024-02-10 ENCOUNTER — Telehealth: Payer: Self-pay

## 2024-02-10 NOTE — Telephone Encounter (Signed)
 Copied from CRM [id number]. Topic: Clinical - Medication Question >> Feb 10, 2024 11:53 AM Oddis Bench wrote: Reason for CRM: Patient is calling about esomeprazole  (NEXIUM ) 40 MG capsule he is stating that it was prescribed by [facility] and would like it to be trans and prescribed by [facility]. The patient would like to have a call back to be informed if will need appt to do so.

## 2024-02-10 NOTE — Telephone Encounter (Signed)
 Ok. Thanks!

## 2024-02-17 ENCOUNTER — Other Ambulatory Visit: Payer: Self-pay | Admitting: *Deleted

## 2024-02-17 ENCOUNTER — Other Ambulatory Visit: Payer: PRIVATE HEALTH INSURANCE

## 2024-02-17 DIAGNOSIS — Z131 Encounter for screening for diabetes mellitus: Secondary | ICD-10-CM

## 2024-02-17 DIAGNOSIS — E7800 Pure hypercholesterolemia, unspecified: Secondary | ICD-10-CM

## 2024-02-18 ENCOUNTER — Ambulatory Visit: Payer: Self-pay | Admitting: Family Medicine

## 2024-02-18 LAB — CBC WITH DIFFERENTIAL/PLATELET
Basophils Absolute: 0.1 10*3/uL (ref 0.0–0.2)
Basos: 1 %
EOS (ABSOLUTE): 0.1 10*3/uL (ref 0.0–0.4)
Eos: 1 %
Hematocrit: 48.1 % (ref 37.5–51.0)
Hemoglobin: 16 g/dL (ref 13.0–17.7)
Immature Grans (Abs): 0 10*3/uL (ref 0.0–0.1)
Immature Granulocytes: 0 %
Lymphocytes Absolute: 2.2 10*3/uL (ref 0.7–3.1)
Lymphs: 42 %
MCH: 29 pg (ref 26.6–33.0)
MCHC: 33.3 g/dL (ref 31.5–35.7)
MCV: 87 fL (ref 79–97)
Monocytes Absolute: 0.4 10*3/uL (ref 0.1–0.9)
Monocytes: 9 %
Neutrophils Absolute: 2.4 10*3/uL (ref 1.4–7.0)
Neutrophils: 47 %
Platelets: 183 10*3/uL (ref 150–450)
RBC: 5.51 x10E6/uL (ref 4.14–5.80)
RDW: 12.4 % (ref 11.6–15.4)
WBC: 5.1 10*3/uL (ref 3.4–10.8)

## 2024-02-18 LAB — LIPID PANEL
Chol/HDL Ratio: 3.3 ratio (ref 0.0–5.0)
Cholesterol, Total: 146 mg/dL (ref 100–199)
HDL: 44 mg/dL (ref >39)
LDL Chol Calc (NIH): 86 mg/dL (ref 0–99)
Triglycerides: 83 mg/dL (ref 0–149)
VLDL Cholesterol Cal: 16 mg/dL (ref 5–40)

## 2024-02-18 LAB — COMPREHENSIVE METABOLIC PANEL WITH GFR
ALT: 19 IU/L (ref 0–44)
AST: 21 IU/L (ref 0–40)
Albumin: 4.7 g/dL (ref 4.3–5.2)
Alkaline Phosphatase: 88 IU/L (ref 44–121)
BUN/Creatinine Ratio: 15 (ref 9–20)
BUN: 14 mg/dL (ref 6–20)
Bilirubin Total: 0.4 mg/dL (ref 0.0–1.2)
CO2: 22 mmol/L (ref 20–29)
Calcium: 9.7 mg/dL (ref 8.7–10.2)
Chloride: 101 mmol/L (ref 96–106)
Creatinine, Ser: 0.91 mg/dL (ref 0.76–1.27)
Globulin, Total: 1.9 g/dL (ref 1.5–4.5)
Glucose: 90 mg/dL (ref 70–99)
Potassium: 4.3 mmol/L (ref 3.5–5.2)
Sodium: 138 mmol/L (ref 134–144)
Total Protein: 6.6 g/dL (ref 6.0–8.5)
eGFR: 120 mL/min/{1.73_m2} (ref >59)

## 2024-02-18 LAB — HEMOGLOBIN A1C
Est. average glucose Bld gHb Est-mCnc: 100 mg/dL
Hgb A1c MFr Bld: 5.1 % (ref 4.8–5.6)

## 2024-02-18 LAB — TSH: TSH: 1.34 u[IU]/mL (ref 0.450–4.500)

## 2024-02-23 ENCOUNTER — Encounter: Payer: Self-pay | Admitting: Family Medicine

## 2024-02-23 ENCOUNTER — Ambulatory Visit (INDEPENDENT_AMBULATORY_CARE_PROVIDER_SITE_OTHER): Payer: PRIVATE HEALTH INSURANCE | Admitting: Family Medicine

## 2024-02-23 VITALS — BP 137/76 | HR 72 | Ht 71.0 in | Wt 210.8 lb

## 2024-02-23 DIAGNOSIS — K219 Gastro-esophageal reflux disease without esophagitis: Secondary | ICD-10-CM

## 2024-02-23 DIAGNOSIS — Z0000 Encounter for general adult medical examination without abnormal findings: Secondary | ICD-10-CM | POA: Diagnosis not present

## 2024-02-23 DIAGNOSIS — E785 Hyperlipidemia, unspecified: Secondary | ICD-10-CM

## 2024-02-23 DIAGNOSIS — R7989 Other specified abnormal findings of blood chemistry: Secondary | ICD-10-CM | POA: Diagnosis not present

## 2024-02-23 MED ORDER — ESOMEPRAZOLE MAGNESIUM 40 MG PO CPDR: 40.0000 mg | DELAYED_RELEASE_CAPSULE | Freq: Every day | ORAL | 3 refills | Status: AC

## 2024-02-23 NOTE — Assessment & Plan Note (Signed)
 Now normal.  Continue to monitor

## 2024-02-23 NOTE — Assessment & Plan Note (Signed)
 Returned to normal this time.  Continue to monitor

## 2024-02-23 NOTE — Assessment & Plan Note (Signed)
-   Controlled on Nexium  40mg  daily - Prescription transferred from GI specialist to this office - Nexium  40mg  daily #90 with 3 refills sent to [facility].

## 2024-02-23 NOTE — Progress Notes (Signed)
   Annual physical  Subjective   Patient ID: Christian Payne, male    DOB: [birth year]  Age: 23 y.o. MRN: [id number]  Chief Complaint  Patient presents with   Annual Exam   HPI Christian Payne is a 23 y.o. old male here  for annual exam.   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[employer], Chiropractor with Manufacturing engineer degree Married, first child (boy) expected in July 2025 Occasional alcohol use, 3-4 drinks per week No tobacco use No structured exercise but active with home remodeling No dietary restrictions  ROS Denies any GI symptoms when compliant with Nexium   Objective  The ASCVD Risk score (Arnett DK, et al., 2019) failed to calculate for the following reasons:   The 2019 ASCVD risk score is only valid for ages 44 to 11  Health Maintenance Due  Topic Date Due   HIV Screening  Never done   Hepatitis C Screening  Never done   COVID-19 Vaccine (2 - 2024-25 season) 05/25/2023      Objective:     BP 137/76   Pulse 72   Ht 5\' 11"  (1.803 m)   Wt 210 lb 12.8 oz (95.6 kg)   SpO2 99%   BMI 29.40 kg/m    Physical Exam General: Alert, oriented HEENT: PERRLA, EOMI, moist mucosa CV: Regular rate rhythm no murmurs Pulmonary: Lungs clear bilaterally GI: Soft, nontender Extremities: No pedal edema MSK: Normal gait, strength equal bilaterally Psych: Pleasant affect   No results found for any visits on 02/23/24.      Assessment & Plan:   Physical exam, annual  Gastroesophageal reflux disease without esophagitis Assessment & Plan: - Controlled on Nexium  40mg  daily - Prescription transferred from GI  specialist to this office - Nexium  40mg  daily #90 with 3 refills sent to [facility].   Elevated liver function tests Assessment & Plan: Returned to normal this time.  Continue to monitor   Dyslipidemia, goal LDL below 100 Assessment & Plan: Now normal.  Continue to monitor   Other orders -     Esomeprazole  Magnesium ; Take 1 capsule (40 mg total) by mouth daily at 12 noon.  Dispense: 90 capsule; Refill: 3     Return in about 1 year (around 02/22/2025) for physical.    Laneta Pintos, MD

## 2024-02-23 NOTE — Patient Instructions (Signed)
 It was nice to see you today,  We addressed the following topics today: -I sent in your Nexium .  I sent in 90-day supply with 3 refills to last you 1 year. - All of your lab test were normal this year.  Have a great day,  Etha Henle, MD

## 2025-02-16 ENCOUNTER — Other Ambulatory Visit

## 2025-02-23 ENCOUNTER — Encounter: Admitting: Family Medicine
